# Patient Record
Sex: Male | Born: 1975 | Marital: Married | Smoking: Never smoker
Health system: Southern US, Community
[De-identification: ages and names within clinical notes are randomized; demographics above are authoritative.]

---

## 2013-12-28 ENCOUNTER — Ambulatory Visit: Payer: Self-pay | Admitting: Physician Assistant

## 2014-01-02 ENCOUNTER — Ambulatory Visit (INDEPENDENT_AMBULATORY_CARE_PROVIDER_SITE_OTHER): Payer: BC Managed Care – PPO

## 2014-01-02 ENCOUNTER — Ambulatory Visit (INDEPENDENT_AMBULATORY_CARE_PROVIDER_SITE_OTHER): Payer: BC Managed Care – PPO | Admitting: Physician Assistant

## 2014-01-02 ENCOUNTER — Encounter: Payer: Self-pay | Admitting: Physician Assistant

## 2014-01-02 VITALS — BP 114/76 | HR 63 | Wt 187.0 lb

## 2014-01-02 DIAGNOSIS — M25519 Pain in unspecified shoulder: Secondary | ICD-10-CM

## 2014-01-02 DIAGNOSIS — M629 Disorder of muscle, unspecified: Secondary | ICD-10-CM

## 2014-01-02 DIAGNOSIS — R198 Other specified symptoms and signs involving the digestive system and abdomen: Secondary | ICD-10-CM

## 2014-01-02 DIAGNOSIS — R140 Abdominal distension (gaseous): Secondary | ICD-10-CM

## 2014-01-02 DIAGNOSIS — R141 Gas pain: Secondary | ICD-10-CM

## 2014-01-02 DIAGNOSIS — M25512 Pain in left shoulder: Secondary | ICD-10-CM

## 2014-01-02 DIAGNOSIS — R142 Eructation: Secondary | ICD-10-CM

## 2014-01-02 DIAGNOSIS — M2420 Disorder of ligament, unspecified site: Secondary | ICD-10-CM

## 2014-01-02 DIAGNOSIS — M7631 Iliotibial band syndrome, right leg: Secondary | ICD-10-CM

## 2014-01-02 DIAGNOSIS — R143 Flatulence: Secondary | ICD-10-CM

## 2014-01-02 MED ORDER — MELOXICAM 15 MG PO TABS: 15.0000 mg | ORAL_TABLET | Freq: Every day | ORAL | Status: AC

## 2014-01-02 NOTE — Patient Instructions (Addendum)
Shoulder Exercises EXERCISES  RANGE OF MOTION (ROM) AND STRETCHING EXERCISES These exercises may help you when beginning to rehabilitate your injury. Your symptoms may resolve with or without further involvement from your physician, physical therapist or athletic trainer. While completing these exercises, remember:   Restoring tissue flexibility helps normal motion to return to the joints. This allows healthier, less painful movement and activity.  An effective stretch should be held for at least 30 seconds.  A stretch should never be painful. You should only feel a gentle lengthening or release in the stretched tissue. ROM - Pendulum  Bend at the waist so that your right / left arm falls away from your body. Support yourself with your opposite hand on a solid surface, such as a table or a countertop.  Your right / left arm should be perpendicular to the ground. If it is not perpendicular, you need to lean over farther. Relax the muscles in your right / left arm and shoulder as much as possible.  Gently sway your hips and trunk so they move your right / left arm without any use of your right / left shoulder muscles.  Progress your movements so that your right / left arm moves side to side, then forward and backward, and finally, both clockwise and counterclockwise.  Complete __________ repetitions in each direction. Many people use this exercise to relieve discomfort in their shoulder as well as to gain range of motion. Repeat __________ times. Complete this exercise __________ times per day. STRETCH  Flexion, Standing  Stand with good posture. With an underhand grip on your right / left hand and an overhand grip on the opposite hand, grasp a broomstick or cane so that your hands are a little more than shoulder-width apart.  Keeping your right / left elbow straight and shoulder muscles relaxed, push the stick with your opposite hand to raise your right / left arm in front of your body and  then overhead. Raise your arm until you feel a stretch in your right / left shoulder, but before you have increased shoulder pain.  Try to avoid shrugging your right / left shoulder as your arm rises by keeping your shoulder blade tucked down and toward your mid-back spine. Hold __________ seconds.  Slowly return to the starting position. Repeat __________ times. Complete this exercise __________ times per day. STRETCH - Internal Rotation  Place your right / left hand behind your back, palm-up.  Throw a towel or belt over your opposite shoulder. Grasp the towel/belt with your right / left hand.  While keeping an upright posture, gently pull up on the towel/belt until you feel a stretch in the front of your right / left shoulder.  Avoid shrugging your right / left shoulder as your arm rises by keeping your shoulder blade tucked down and toward your mid-back spine.  Hold __________. Release the stretch by lowering your opposite hand. Repeat __________ times. Complete this exercise __________ times per day. STRETCH - External Rotation and Abduction  Stagger your stance through a doorframe. It does not matter which foot is forward.  As instructed by your physician, physical therapist or athletic trainer, place your hands:  And forearms above your head and on the door frame.  And forearms at head-height and on the door frame.  At elbow-height and on the door frame.  Keeping your head and chest upright and your stomach muscles tight to prevent over-extending your low-back, slowly shift your weight onto your front foot until you feel   a stretch across your chest and/or in the front of your shoulders.  Hold __________ seconds. Shift your weight to your back foot to release the stretch. Repeat __________ times. Complete this stretch __________ times per day.  STRENGTHENING EXERCISES  These exercises may help you when beginning to rehabilitate your injury. They may resolve your symptoms with  or without further involvement from your physician, physical therapist or athletic trainer. While completing these exercises, remember:   Muscles can gain both the endurance and the strength needed for everyday activities through controlled exercises.  Complete these exercises as instructed by your physician, physical therapist or athletic trainer. Progress the resistance and repetitions only as guided.  You may experience muscle soreness or fatigue, but the pain or discomfort you are trying to eliminate should never worsen during these exercises. If this pain does worsen, stop and make certain you are following the directions exactly. If the pain is still present after adjustments, discontinue the exercise until you can discuss the trouble with your clinician.  If advised by your physician, during your recovery, avoid activity or exercises which involve actions that place your right / left hand or elbow above your head or behind your back or head. These positions stress the tissues which are trying to heal. STRENGTH - Scapular Depression and Adduction  With good posture, sit on a firm chair. Supported your arms in front of you with pillows, arm rests or a table top. Have your elbows in line with the sides of your body.  Gently draw your shoulder blades down and toward your mid-back spine. Gradually increase the tension without tensing the muscles along the top of your shoulders and the back of your neck.  Hold for __________ seconds. Slowly release the tension and relax your muscles completely before completing the next repetition.  After you have practiced this exercise, remove the arm support and complete it in standing as well as sitting. Repeat __________ times. Complete this exercise __________ times per day.  STRENGTH - External Rotators  Secure a rubber exercise band/tubing to a fixed object so that it is at the same height as your right / left elbow when you are standing or sitting on a  firm surface.  Stand or sit so that the secured exercise band/tubing is at your side that is not injured.  Bend your elbow 90 degrees. Place a folded towel or small pillow under your right / left arm so that your elbow is a few inches away from your side.  Keeping the tension on the exercise band/tubing, pull it away from your body, as if pivoting on your elbow. Be sure to keep your body steady so that the movement is only coming from your shoulder rotating.  Hold __________ seconds. Release the tension in a controlled manner as you return to the starting position. Repeat __________ times. Complete this exercise __________ times per day.  STRENGTH - Supraspinatus  Stand or sit with good posture. Grasp a __________ weight or an exercise band/tubing so that your hand is "thumbs-up," like when you shake hands.  Slowly lift your right / left hand from your thigh into the air, traveling about 30 degrees from straight out at your side. Lift your hand to shoulder height or as far as you can without increasing any shoulder pain. Initially, many people do not lift their hands above shoulder height.  Avoid shrugging your right / left shoulder as your arm rises by keeping your shoulder blade tucked down and toward your   mid-back spine.  Hold for __________ seconds. Control the descent of your hand as you slowly return to your starting position. Repeat __________ times. Complete this exercise __________ times per day.  STRENGTH - Shoulder Extensors  Secure a rubber exercise band/tubing so that it is at the height of your shoulders when you are either standing or sitting on a firm arm-less chair.  With a thumbs-up grip, grasp an end of the band/tubing in each hand. Straighten your elbows and lift your hands straight in front of you at shoulder height. Step back away from the secured end of band/tubing until it becomes tense.  Squeezing your shoulder blades together, pull your hands down to the sides of  your thighs. Do not allow your hands to go behind you.  Hold for __________ seconds. Slowly ease the tension on the band/tubing as you reverse the directions and return to the starting position. Repeat __________ times. Complete this exercise __________ times per day.  STRENGTH - Scapular Retractors  Secure a rubber exercise band/tubing so that it is at the height of your shoulders when you are either standing or sitting on a firm arm-less chair.  With a palm-down grip, grasp an end of the band/tubing in each hand. Straighten your elbows and lift your hands straight in front of you at shoulder height. Step back away from the secured end of band/tubing until it becomes tense.  Squeezing your shoulder blades together, draw your elbows back as you bend them. Keep your upper arm lifted away from your body throughout the exercise.  Hold __________ seconds. Slowly ease the tension on the band/tubing as you reverse the directions and return to the starting position. Repeat __________ times. Complete this exercise __________ times per day. STRENGTH  Scapular Depressors  Find a sturdy chair without wheels, such as a from a dining room table.  Keeping your feet on the floor, lift your bottom from the seat and lock your elbows.  Keeping your elbows straight, allow gravity to pull your body weight down. Your shoulders will rise toward your ears.  Raise your body against gravity by drawing your shoulder blades down your back, shortening the distance between your shoulders and ears. Although your feet should always maintain contact with the floor, your feet should progressively support less body weight as you get stronger.  Hold __________ seconds. In a controlled and slow manner, lower your body weight to begin the next repetition. Repeat __________ times. Complete this exercise __________ times per day.  Document Released: 10/27/2005 Document Revised: 03/06/2012 Document Reviewed: 03/27/2009 [facility]  Patient Information 2014 [place], [place].   Irritable Bowel Syndrome  Add Probiotic every day.    Irritable Bowel Syndrome (IBS) is caused by a disturbance of normal bowel function. Other terms used are spastic colon, mucous colitis, and irritable colon. It does not require surgery, nor does it lead to cancer. There is no cure for IBS. But with proper diet, stress reduction, and medication, you will find that your problems (symptoms) will gradually disappear or improve. IBS is a common digestive disorder. It usually appears in late adolescence or early adulthood. Women develop it twice as often as men. CAUSES  After food has been digested and absorbed in the small intestine, waste material is moved into the colon (large intestine). In the colon, water and salts are absorbed from the undigested products coming from the small intestine. The remaining residue, or fecal material, is held for elimination. Under normal circumstances, gentle, rhythmic contractions on the bowel walls  push the fecal material along the colon towards the rectum. In IBS, however, these contractions are irregular and poorly coordinated. The fecal material is either retained too long, resulting in constipation, or expelled too soon, producing diarrhea. SYMPTOMS  The most common symptom of IBS is pain. It is typically in the lower left side of the belly (abdomen). But it may occur anywhere in the abdomen. It can be felt as heartburn, backache, or even as a dull pain in the arms or shoulders. The pain comes from excessive bowel-muscle spasms and from the buildup of gas and fecal material in the colon. This pain:  Can range from sharp belly (abdominal) cramps to a dull, continuous ache.  Usually worsens soon after eating.  Is typically relieved by having a bowel movement or passing gas. Abdominal pain is usually accompanied by constipation. But it may also produce diarrhea. The diarrhea typically occurs right after a meal or upon  arising in the morning. The stools are typically soft and watery. They are often flecked with secretions (mucus). Other symptoms of IBS include:  Bloating.  Loss of appetite.  Heartburn.  Feeling sick to your stomach (nausea).  Belching  Vomiting  Gas. IBS may also cause a number of symptoms that are unrelated to the digestive system:  Fatigue.  Headaches.  Anxiety  Shortness of breath  Difficulty in concentrating.  Dizziness. These symptoms tend to come and go. DIAGNOSIS  The symptoms of IBS closely mimic the symptoms of other, more serious digestive disorders. So your caregiver may wish to perform a variety of additional tests to exclude these disorders. He/she wants to be certain of learning what is wrong (diagnosis). The nature and purpose of each test will be explained to you. TREATMENT A number of medications are available to help correct bowel function and/or relieve bowel spasms and abdominal pain. Among the drugs available are:  Mild, non-irritating laxatives for severe constipation and to help restore normal bowel habits.  Specific anti-diarrheal medications to treat severe or prolonged diarrhea.  Anti-spasmodic agents to relieve intestinal cramps.  Your caregiver may also decide to treat you with a mild tranquilizer or sedative during unusually stressful periods in your life. The important thing to remember is that if any drug is prescribed for you, make sure that you take it exactly as directed. Make sure that your caregiver knows how well it worked for you. HOME CARE INSTRUCTIONS   Avoid foods that are high in fat or oils. Some examples [id number]:heavy cream, butter, frankfurters, sausage, and other fatty meats.  Avoid foods that have a laxative effect, such as fruit, fruit juice, and dairy products.  Cut out carbonated drinks, chewing gum, and "gassy" foods, such as beans and cabbage. This may help relieve bloating and belching.  Bran taken with plenty of  liquids may help relieve constipation.  Keep track of what foods seem to trigger your symptoms.  Avoid emotionally charged situations or circumstances that produce anxiety.  Start or continue exercising.  Get plenty of rest and sleep. MAKE SURE YOU:   Understand these instructions.  Will watch your condition.  Will get help right away if you are not doing well or get worse. Document Released: 12/13/2005 Document Revised: 03/06/2012 Document Reviewed: 08/02/2008 [facility] Patient Information 2014 [place], [place].   Gluten-Free Diet Gluten is a protein found in many grains. Gluten is present in wheat, rye, and barley. Gluten from wheat, rye, and barley protein interferes with the absorption of food in people with gluten sensitivity. It  may also cause intestinal injury when eaten by individuals with gluten sensitivity.  A sample piece (biopsy) of the small intestine is usually required for a positive diagnosis of gluten sensitivity. Dietary treatment consists of eliminating foods and food ingredients from wheat, rye, and barley. When these are taken out of the diet completely, most people regain function of the small intestine. Strict compliance is important even during symptom-free periods. People with gluten sensitivity need to be on a gluten-free diet for a lifetime. During the first stages of treatment, some people will also need to restrict dairy products that contain lactose, which is a naturally occurring sugar. Lactose is difficult to absorb when the small intestines are damaged (lactose intolerance).  WHO NEEDS THIS DIET Some people who have certain diseases need to be on a gluten-free diet. These diseases include:  Celiac disease.  Nontropical sprue.  Gluten-sensitive enteropathy.  Dermatitis herpetiformis. SPECIAL NOTES  Read all labels because gluten may have been added as an incidental ingredient. Words to check for on the label include: flour, starch, durum flour, graham  flour, phosphated flour, self-rising flour, semolina, farina, modified food starch, cereal, thickening, fillers, emulsifiers, any kind of malt flavoring, and hydrolyzed vegetable protein. A registered dietician can help you identify possible harmful ingredients in the foods you normally eat.  If you are not sure whether an ingredient contains gluten, check with the manufacturer. Note that some manufacturers may change ingredients without notice. Always read labels.  Since flour and cereal products are often used in the preparation of foods, it is important to be aware of the methods of preparation used, as well as the foods themselves. This is especially true when you are dining out. Starches  Allowed: Only those prepared from arrowroot, corn, potato, rice, and bean flours. Rice wafers(*), pure cornmeal tortillas, popcorn, some crackers, and chips(*). Hot cereals made from cornmeal. Ask your dietician which specific hot and cold cereals are allowed. White or sweet potatoes, yams, hominy, rice or wild rice, and special gluten-free pasta. Some oriental rice noodles or bean noodles.  Avoid: All wheat and rye cereals, wheat germ, barley, bran, graham, malt, bulgur, and millet(-). NOTE: Avoid cereals containing malt as a flavoring, such as rice cereal. Regular noodles, spaghetti, macaroni, and most packaged rice mixes(*). All others containing wheat, rye, or barley. Vegetables  Allowed: All plain, fresh, frozen, or canned vegetables.  Avoid: Creamed vegetables(*) and vegetables canned in sauces(*). Any prepared with wheat, rye, or barley. Fruit  Allowed: All fresh, frozen, canned, or dried fruits. Fruit juices.  Avoid: Thickened or prepared fruits and some pie fillings(*). Meat and Meat Substitutes  Allowed: Meat, fish, poultry, or eggs prepared without added wheat, rye, or barley. Luncheon meat(*), frankfurters(*), and pure meat. All aged cheese and processed cheese products(*). Cottage cheese(+)  and cream cheese(+). Dried beans, dried peas, and lentils.  Avoid: Any meat or meat alternate containing wheat, rye, barley, or gluten stabilizers. Bread-containing products, such as Swiss steak, croquettes, and meatloaf. Tuna canned in vegetable broth(*); [place] with HVP injected as part of the basting; any cheese product containing oat gum as an ingredient. Milk  Allowed: Milk. Yogurt made with allowed ingredients(*).  Avoid: Commercial chocolate milk which may have cereal added(*). Malted milk. Soups and Combination Foods  Allowed: Homemade broth and soups made with allowed ingredients; some canned or frozen soups are allowed(*). Combination or prepared foods that do not contain gluten(*). Read labels.  Avoid: All soups containing wheat, rye, or barley flour. Bouillon and bouillon cubes  that contain hydrolyzed vegetable protein (HVP). Combination or prepared foods that contain gluten(*). Desserts  Allowed:  Custard, some pudding mixes(*), homemade puddings from cornstarch, rice, and tapioca. Gelatin desserts, ices, and sherbet(*). Cake, cookies, and other desserts prepared with allowed flours. Some commercial ice creams(*). Ask your dietician about specific brands of dessert that are allowed.  Avoid: Cakes, cookies, doughnuts, and pastries that are prepared with wheat, rye, or barley flour. Some commercial ice creams(*), ice cream flavors which contain cookies, crumbs, or cheesecake(*). Ice cream cones. All commercially prepared mixes for cakes, cookies, and other desserts(*). Bread pudding and other puddings thickened with flour. Sweets  Allowed: Sugar, honey, syrup(*), molasses, jelly, jam, plain hard candy, marshmallows, gumdrops, homemade candies free from wheat, rye, or barley. Coconut.  Avoid: Commercial candies containing wheat, rye, or barley(*). Certain buttercrunch toffees are dusted with wheat flour. Ask your dietician about specific brands that are not allowed. Chocolate-coated  nuts, which are often rolled in flour. Fats and Oils  Allowed: Butter, margarine, vegetable oil, sour cream(+), whipping cream, shortening, lard, cream, mayonnaise(*). Some commercial salad dressings(*). Peanut butter.  Avoid: Some commercial salad dressings(*). Beverages  Allowed: Coffee (regular or decaffeinated), tea, herbal tea (read label to be sure that no wheat flour has been added). Carbonated beverages and some root beers(*). Wine, sake, and distilled spirits, such as gin, vodka, and whiskey.  Avoid:  Certain cereal beverages. Ask your dietician about specific brands that are not allowed. Beer (unless gluten-free), ale, malted milk, and some root beers, wine, and sake. Condiments/ Miscellaneous  Allowed: Salt, pepper, herbs, spices, extracts, and food colorings. Monosodium glutamate (MSG). Cider, rice, and wine vinegar. Baking soda and baking powder. Certain soy sauces. Ask your dietician about specific brands that are allowed. Nuts, coconut, chocolate, and pure cocoa powder.  Avoid: Some curry powder(*), some dry seasoning mixes(*), some gravy extracts(*), some meat sauces(*), some catsup(*), some prepared mustard(*), horseradish(*), some soy sauce(*), chip dips(*), and some chewing gum(*). Yeast extract (contains barley). Caramel color (may contain malt). Ask your dietician about specific brands of condiments to avoid. Flour and Thickening Agents  Allowed: Arrowroot starch (A); Corn bran (B); Corn flour (B,C,D); Corn germ (B); Cornmeal (B,C,D); Corn starch (A); Potato flour (B,C,E); Potato starch flour (B,C,E); Rice bran (B); Rice flours: Plain, brown (B,C,D,E), and Sweet (A,B,C,F). Rice polish (B,C,G); Soy flour (B,C,G); Tapioca starch (A). The flour and thickening agents described above are good for: (A) Good thickening agent (B) Good when combined with other flours (C) Best combined with milk and eggs in baked products (D) Best in grainy-textured products (E) Produces drier  product than other flours (F) Produces moister product than other flours (G) Adds distinct flavor to product. Use in moderation. (*) Check labels and investigate any questionable ingredients.  (-) Additional research is needed before this product can be recommended. (+) Check vegetable gum used. SAMPLE MEAL PLAN Breakfast   Orange juice.  Banana.  Rice or corn cereal.  Toast (gluten-free bread).  Heart-healthy tub margarine.  Jam.  Milk.  Coffee or tea. Lunch  Chicken salad sandwich (with gluten-free bread and mayonnaise).  Sliced tomatoes.  Heart-healthy tub margarine.  Apple.  Milk.  Coffee or tea. Dinner  [place].  Baked potato.  Broccoli.  Lettuce salad with gluten-free dressing.  Gluten-free bread.  Custard.  Heart-healthy margarine.  Coffee or tea. These meal plans are provided as samples. Your daily meal plans will vary. Document Released: 12/13/2005 Document Revised: 06/13/2012 Document Reviewed: 01/23/2012 [employer] Patient Information 2014  [employer].

## 2014-01-02 NOTE — Progress Notes (Signed)
Subjective:    Patient ID: Jim May, male    DOB: Apr 04, 1976, 38 y.o.   MRN: [id number]  HPI Patient is a 38 year old Asian male who presents to the clinic to establish care. Patient does not have any ongoing past medical history. Patient is not on any current medications. Patient has not had any surgical history.  . Family History  Problem Relation Age of Onset  . Hypertension Mother   . Hyperlipidemia Mother   . Cancer Father   . Diabetes Father     Patient has multiple concerns today.  His right knee has hurt for approximately one month after playing soccer. He did know 1 months or stretches before plane. He has not played since. Right knee pain hurts on the lateral side below the kneecap and extends up through the IT band. He does not exercise on a regular basis. History and nothing to make better.  His left shoulder has been aching anteriorly for the last 6 months. Pain is not every day just off and on. He denies any trauma. rain makes pain worse and range of motion makes pain better. Not tried anything by mouth.   Patient is also concerned because he's felt bloated for over one year. Sometimes he can only eat one meal a day without feeling very full. He admits to eating a lot of rice and beans. He does get to the bathroom everyday her area of the day. His stools are sometimes hard but does not feel like he is constipated. Denies any straining. Not tried anything to make better and nothing seems to make worse. He has not really tried any diet changes to see if helps.    Review of Systems  All other systems reviewed and are negative.        Objective:   Physical Exam  Constitutional: He is oriented to person, place, and time. He appears well-developed and well-nourished.  HENT:  Head: Normocephalic and atraumatic.  Cardiovascular: Normal rate, regular rhythm and normal heart sounds.   Pulmonary/Chest: Effort normal and breath sounds normal.  Abdominal: Soft. Bowel sounds  are normal.  Some generalized distention over her entire abdomen. No abdominal tenderness in any quadrant.  Musculoskeletal:  Right knee-no joint space tenderness. Normal range of motion without pain. Strength 5 out of 5. Patellar reflexes 2+ and symmetric. There is pain with palpation at the IT band insertion at the tibia.   Left shoulder normal range of motion, no pain over clavicle, acromion or humeral bone to palpation. Strength 5 out of 5. Handgrip normal.  Neurological: He is alert and oriented to person, place, and time.  Skin: Skin is warm and dry.  Psychiatric: He has a normal mood and affect. His behavior is normal.          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 Gave handout with stretches, gave mobic to use as needed. Rest and Ice. Follow up in 2-4 weeks.   Left anterior shoulder pain- I this point pain seems to be arthritic. Encouraged pt to try mobic as needed. Reassured him that exam was normal for function.   Abdominal fullness- unsure of etiology at this point IBS vs Gluten intolerance vs constipation. Will get abdominal xray. Discussed probiotic daily. Considered limiting rice and giving a 2 week gluten free diet a chance. If feels like stools are not regular. Start miralax daily until complete bowel movement. Follow up in 4 weeks to discuss  progress.

## 2014-01-04 DIAGNOSIS — M25512 Pain in left shoulder: Secondary | ICD-10-CM | POA: Insufficient documentation

## 2014-01-04 DIAGNOSIS — M7630 Iliotibial band syndrome, unspecified leg: Secondary | ICD-10-CM | POA: Insufficient documentation

## 2015-01-14 IMAGING — CR DG ABDOMEN 1V
1 series · 1 of 1 positions shown · non-contrast
Comparison: None.

CLINICAL DATA: Abdominal fullness and bloating.

EXAM:
ABDOMEN - 1 VIEW

[view not recorded]
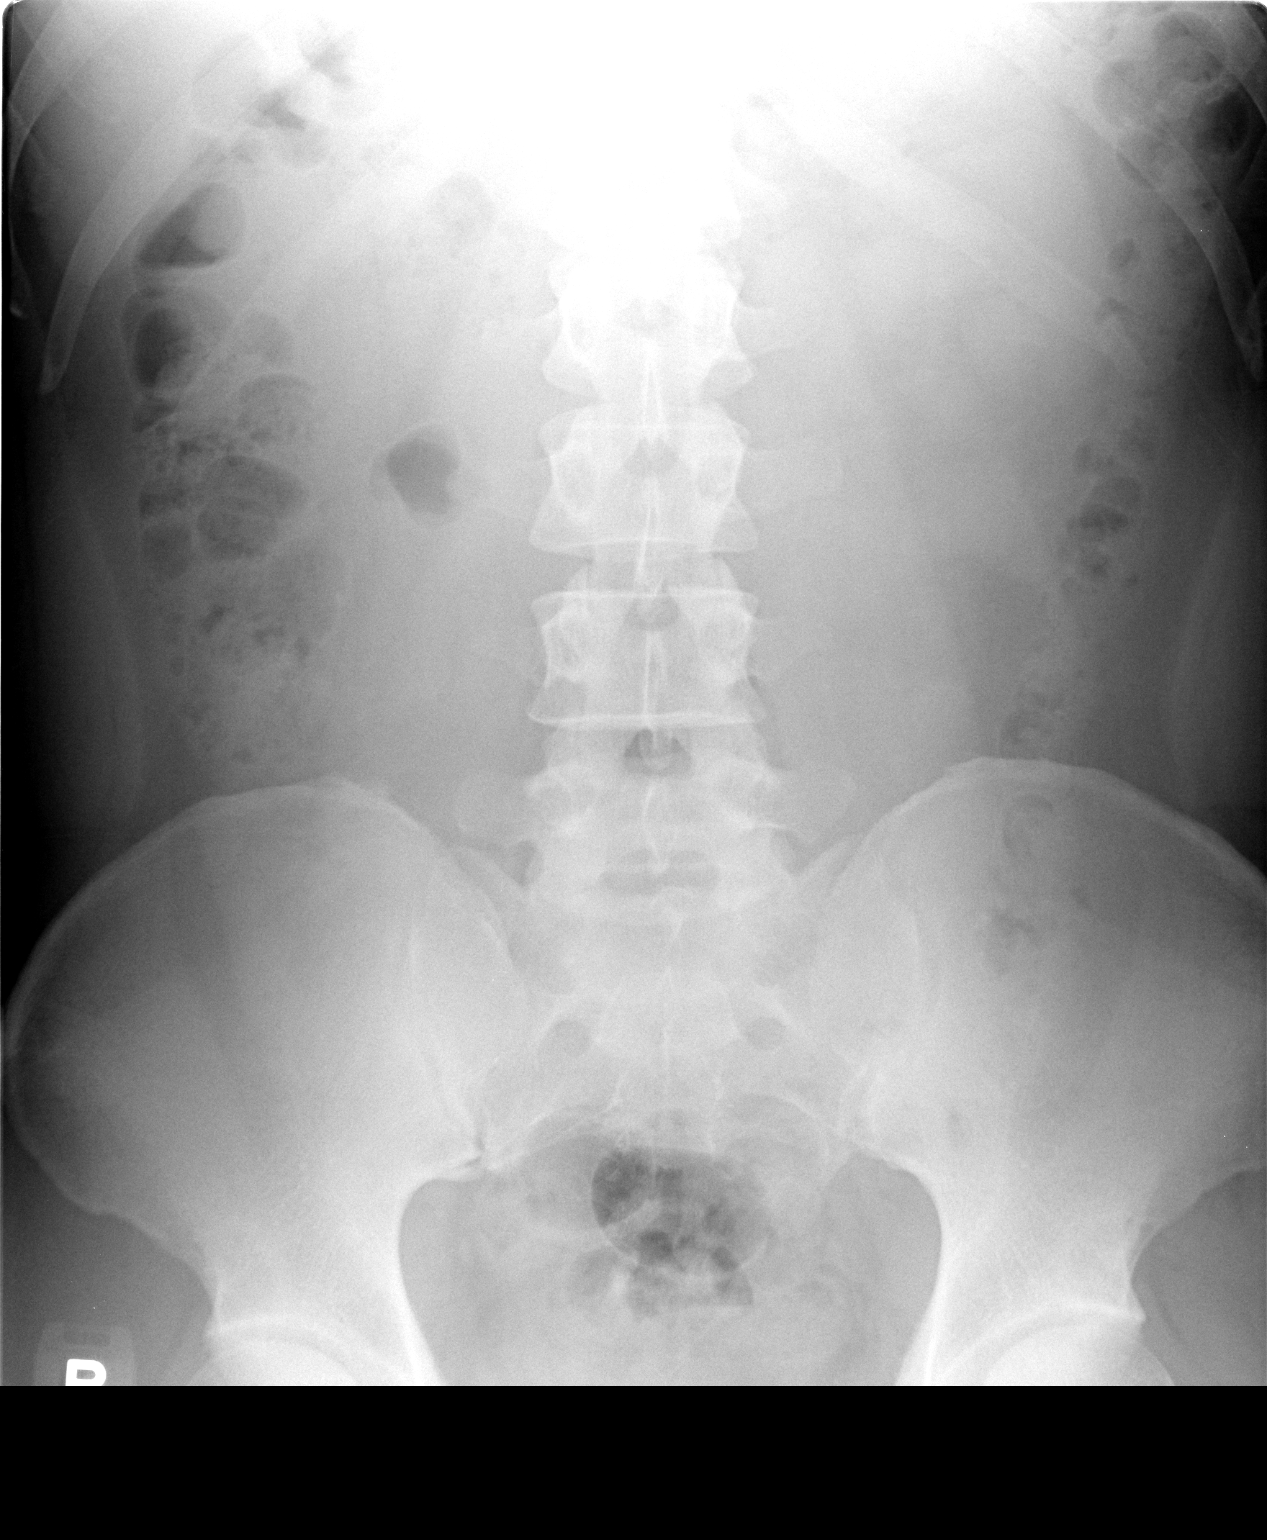

[1 of 1 positions shown; findings below may reference images not displayed]

FINDINGS: The bowel gas pattern is normal. No radio-opaque calculi or other
significant radiographic abnormality are seen.
IMPRESSION: Negative.
